# Patient Record
Sex: Female | Born: 1935 | Race: White | Hispanic: No | Marital: Married
Health system: Southern US, Community
[De-identification: ages and names within clinical notes are randomized; demographics above are authoritative.]

---

## 2009-11-07 ENCOUNTER — Ambulatory Visit: Payer: Self-pay | Admitting: Diagnostic Radiology

## 2009-11-07 ENCOUNTER — Emergency Department (HOSPITAL_BASED_OUTPATIENT_CLINIC_OR_DEPARTMENT_OTHER): Admission: EM | Admit: 2009-11-07 | Discharge: 2009-11-07 | Payer: Self-pay | Admitting: Emergency Medicine

## 2011-06-18 IMAGING — CR DG ANKLE COMPLETE 3+V*L*
3 series · 3 of 3 positions shown · non-contrast
Comparison: None

CLINICAL DATA: Fall, left ankle pain

LEFT ANKLE COMPLETE - 3+ VIEW

[t ankle joint ap left]
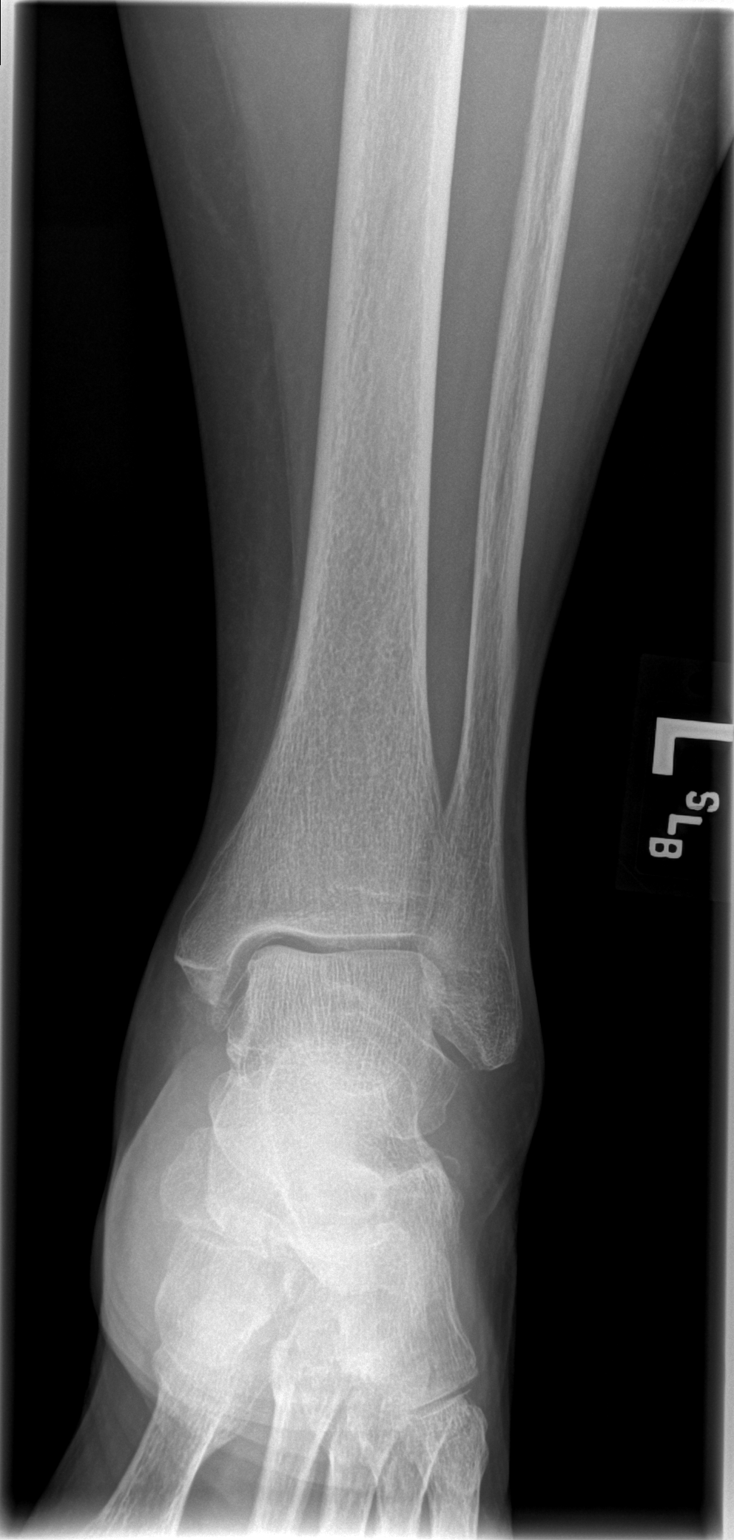

[t ankle joint oblique left]
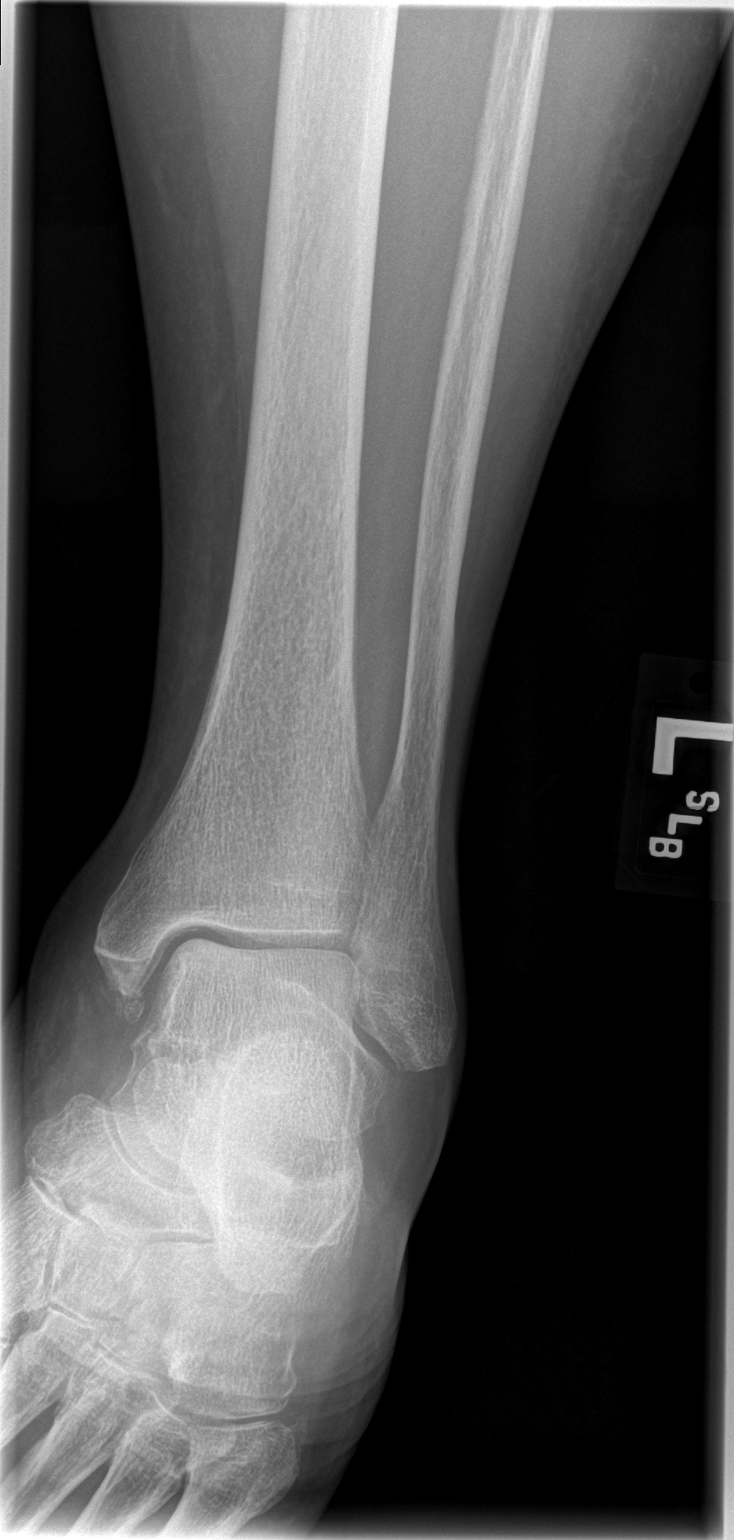

[t ankle joint lat left]
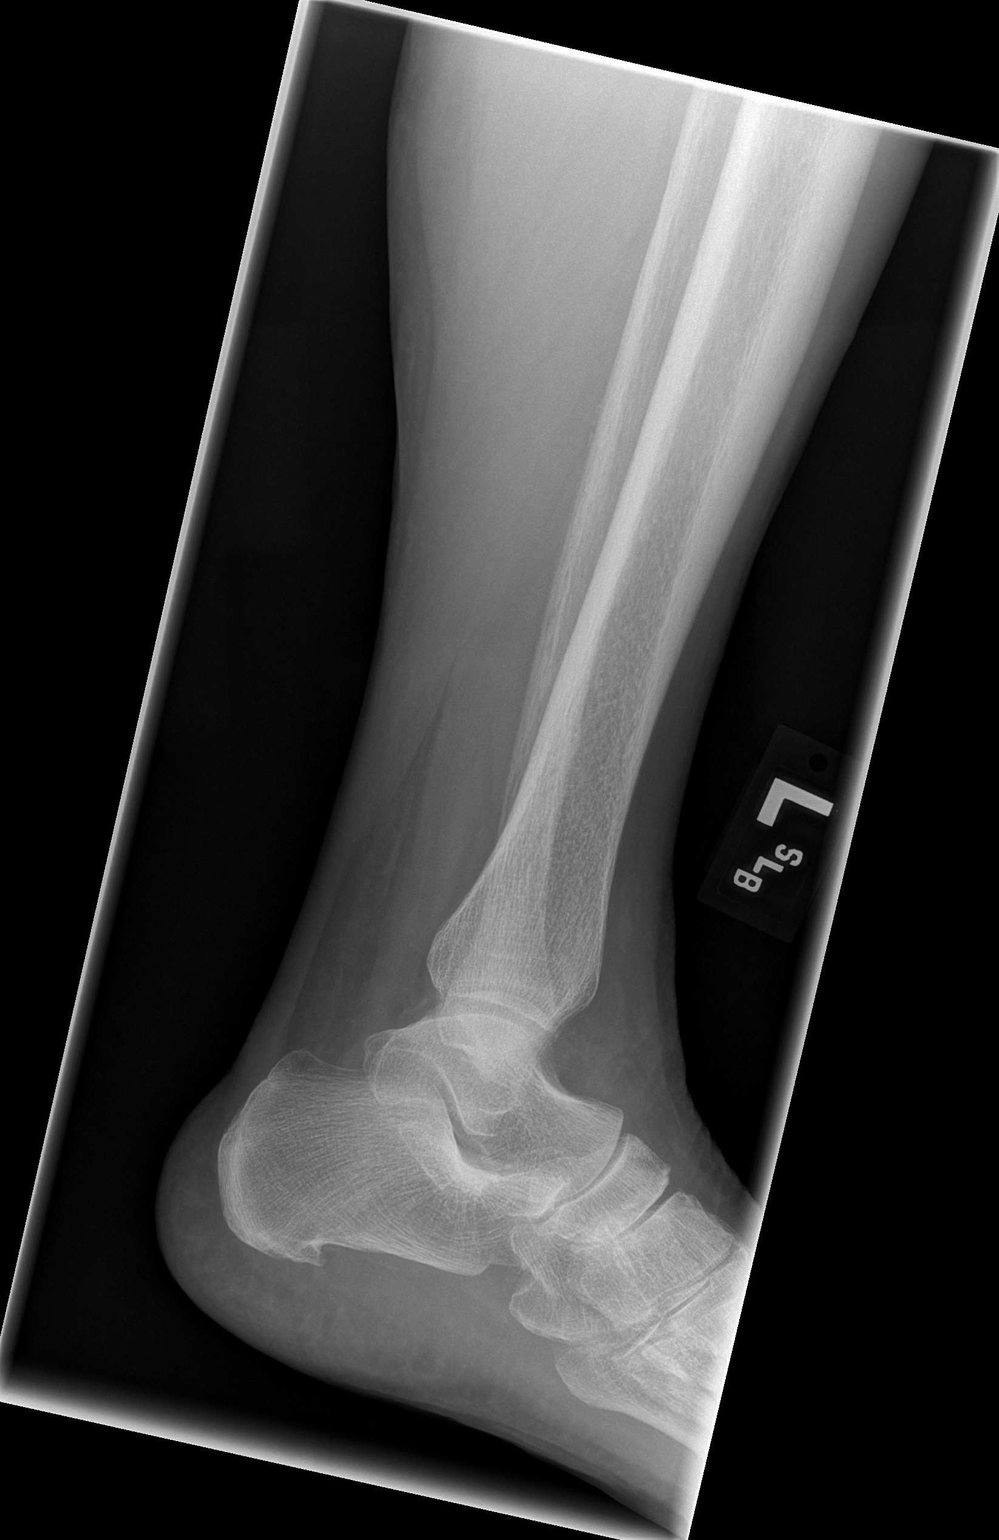

[3 of 3 positions shown; findings below may reference images not displayed]

FINDINGS: Bones demineralized.
Ankle mortise intact.
Thin bone fragment identified at lateral margin of calcaneus
compatible with an avulsion fracture, suspect at calcaneofibular
ligament insertion.
Corticated old appearing ossicles at tip of medial malleolus.
Medial and lateral soft tissue swelling identified.
No additional fracture, dislocation, or bone destruction.
Small plantar calcaneal spur.
IMPRESSION: Lateral calcaneal avulsion fracture, likely at calcaneofibular
ligament insertion.
No definite additional fracture identified.
Bony demineralization.

## 2022-10-28 ENCOUNTER — Emergency Department (HOSPITAL_COMMUNITY): Payer: Medicare Other

## 2022-10-28 ENCOUNTER — Other Ambulatory Visit: Payer: Self-pay

## 2022-10-28 ENCOUNTER — Emergency Department (HOSPITAL_COMMUNITY)
Admission: EM | Admit: 2022-10-28 | Discharge: 2022-10-28 | Disposition: A | Discharge status: Home / Self Care | Payer: Medicare Other | Attending: Emergency Medicine | Admitting: Emergency Medicine

## 2022-10-28 DIAGNOSIS — S52601A Unspecified fracture of lower end of right ulna, initial encounter for closed fracture: Secondary | ICD-10-CM | POA: Diagnosis not present

## 2022-10-28 DIAGNOSIS — M25531 Pain in right wrist: Secondary | ICD-10-CM | POA: Insufficient documentation

## 2022-10-28 DIAGNOSIS — S52501A Unspecified fracture of the lower end of right radius, initial encounter for closed fracture: Secondary | ICD-10-CM | POA: Diagnosis not present

## 2022-10-28 DIAGNOSIS — Z7902 Long term (current) use of antithrombotics/antiplatelets: Secondary | ICD-10-CM | POA: Diagnosis not present

## 2022-10-28 DIAGNOSIS — W010XXA Fall on same level from slipping, tripping and stumbling without subsequent striking against object, initial encounter: Secondary | ICD-10-CM | POA: Insufficient documentation

## 2022-10-28 DIAGNOSIS — Z8673 Personal history of transient ischemic attack (TIA), and cerebral infarction without residual deficits: Secondary | ICD-10-CM | POA: Diagnosis not present

## 2022-10-28 DIAGNOSIS — S59911A Unspecified injury of right forearm, initial encounter: Secondary | ICD-10-CM | POA: Diagnosis present

## 2022-10-28 MED ORDER — ONDANSETRON 4 MG PO TBDP: 4.0000 mg | ORAL_TABLET | Freq: Three times a day (TID) | ORAL | 0 refills | Status: AC | PRN

## 2022-10-28 MED ORDER — ONDANSETRON 4 MG PO TBDP
4.0000 mg | ORAL_TABLET | Freq: Once | ORAL | Status: AC
  Administered 2022-10-28: 4 mg via ORAL
  Filled 2022-10-28: qty 1

## 2022-10-28 MED ORDER — OXYCODONE HCL 5 MG PO TABS
5.0000 mg | ORAL_TABLET | Freq: Once | ORAL | Status: AC
  Administered 2022-10-28: 5 mg via ORAL
  Filled 2022-10-28: qty 1

## 2022-10-28 NOTE — ED Provider Notes (Signed)
[place] EMERGENCY DEPARTMENT AT [facility] Provider Note   CSN: [id number] Arrival date & time: 10/28/22  8657     History  Chief Complaint  Patient presents with   Wrist Injury    Debbie Rhodes is a 89 y.o. female with past medical history significant for stroke presents to the ED complaining of right wrist pain and deformity after mechanical fall.  Patient states she was reaching for something in the pantry, and believes one of her feet slipped out of her slipper causing her to lose her balance and fall.  Patient attempted to break her fall with her right hand.  She does take Plavix daily.  Denies losing consciousness or hitting her head.  Denies other injury related to the fall.       Home Medications Prior to Admission medications   Medication Sig Start Date End Date Taking? Authorizing Provider  ondansetron (ZOFRAN-ODT) 4 MG disintegrating tablet Take 1 tablet (4 mg total) by mouth every 8 (eight) hours as needed for nausea or vomiting. 10/28/22  Yes Mio Schellinger R, PA-C      Allergies    Codeine    Review of Systems   Review of Systems  Musculoskeletal:  Positive for arthralgias and joint swelling. Negative for neck pain.  Neurological:  Negative for syncope.    Physical Exam Updated Vital Signs BP (!) 175/64 (BP Location: Left Arm)   Pulse 62   Temp 98.1 F (36.7 C) (Oral)   Resp 14   Ht 5' (1.524 m)   Wt 68.5 kg   SpO2 95%   BMI 29.49 kg/m  Physical Exam Vitals and nursing note reviewed.  Constitutional:      General: She is not in acute distress.    Appearance: Normal appearance. She is not ill-appearing or diaphoretic.  Cardiovascular:     Rate and Rhythm: Normal rate and regular rhythm.     Pulses:          Radial pulses are 2+ on the right side.  Pulmonary:     Effort: Pulmonary effort is normal.  Musculoskeletal:     Right wrist: Swelling, deformity and tenderness present. Decreased range of motion. Normal pulse.     Comments:  Significant soft tissue swelling and ecchymosis over the right wrist and distal forearm.  Patient has limited ROM due to pain and swelling.  Radial pulse 2+.  Hand is neurovascularly intact.  Skin:    General: Skin is warm and dry.     Capillary Refill: Capillary refill takes less than 2 seconds.  Neurological:     Mental Status: She is alert. Mental status is at baseline.  Psychiatric:        Mood and Affect: Mood normal.        Behavior: Behavior normal.     ED Results / Procedures / Treatments   Labs (all labs ordered are listed, but only abnormal results are displayed) Labs Reviewed - No data to display  EKG EKG Interpretation Date/Time:  Thursday October 28 2022 03:18:19 EDT Ventricular Rate:  69 PR Interval:  170 QRS Duration:  113 QT Interval:  445 QTC Calculation: 470 R Axis:   43  Text Interpretation: Sinus rhythm Borderline intraventricular conduction delay Minimal ST elevation, anterior leads Poor data quality Confirmed by Tilden Fossa [phone]) on 10/28/2022 4:07:11 AM  Radiology DG Wrist Complete Right  Result Date: 10/28/2022 CLINICAL DATA:  Fall EXAM: RIGHT WRIST - COMPLETE 3+ VIEW COMPARISON:  None Available. FINDINGS: Comminuted  fractures of the right distal radius and ulna. There is dorsal angulation and mild lateral displacement of the dominant distal radius fragment. The ulnar styloid fragment is mildly displaced. There is chondrocalcinosis at the wrist joint. IMPRESSION: Comminuted fractures of the right distal radius and ulna with dorsal angulation and mild lateral displacement of the dominant distal radius fragment. Electronically Signed   By: Deatra Robinson M.D.   On: 10/28/2022 03:49    Procedures Procedures    Medications Ordered in ED Medications  oxyCODONE (Oxy IR/ROXICODONE) immediate release tablet 5 mg (5 mg Oral Given 10/28/22 0501)  ondansetron (ZOFRAN-ODT) disintegrating tablet 4 mg (4 mg Oral Given 10/28/22 0502)    ED Course/ Medical Decision  Making/ A&P                                 Medical Decision Making Amount and/or Complexity of Data Reviewed Radiology: ordered.  Risk Prescription drug management.   This patient presents to the ED with chief complaint(s) of right wrist pain and deformity after a mechanical fall with pertinent past medical history of stroke, takes Plavix daily.  The complaint involves an extensive differential diagnosis and also carries with it a high risk of complications and morbidity.    The differential diagnosis includes acute fracture or dislocation, contusion  Initial Assessment:   Exam significant for soft tissue swelling and ecchymosis over the right wrist and distal forearm.  Patient has limited ROM due to pain and swelling.  Radial pulse 2+.  Hand is neurovascularly intact.  No other gross deformities or obvious injuries on exam.  Independent interpretation of imaging: I ordered and personally interpreted x-ray of right wrist which demonstrates comminuted fractures of the distal radius and ulna.  There is dorsal angulation mild lateral displacement of the dominant distal radius fragment.  Ulnar styloid fragment mildly displaced.  Treatment and Reassessment: Patient given oral pain medicine and nausea medication with significant improvement.  Patient placed in a sugar-tong splint and will follow-up outpatient with hand surgery.  Disposition:   Provided patient with referral information to follow-up with hand surgery.  Discussed proper splint care with patient and use of Tylenol for pain.  Will place [facility] consult so that patient is able to receive help at home.  Patient's family is willing to help her until other care can be arranged.  The patient has been appropriately medically screened and/or stabilized in the ED. I have low suspicion for any other emergent medical condition which would require further screening, evaluation or treatment in the ED or require inpatient management. At time of  discharge the patient is hemodynamically stable and in no acute distress. I have discussed work-up results and diagnosis with patient and answered all questions. Patient is agreeable with discharge plan. We discussed strict return precautions for returning to the emergency department and they verbalized understanding.            Final Clinical Impression(s) / ED Diagnoses Final diagnoses:  Closed fracture of distal end of right radius, unspecified fracture morphology, initial encounter  Closed fracture of distal end of right ulna, unspecified fracture morphology, initial encounter    Rx / DC Orders ED Discharge Orders          Ordered    ondansetron (ZOFRAN-ODT) 4 MG disintegrating tablet  Every 8 hours PRN        10/28/22 0543  Melton Alar R, PA-C 10/28/22 0544    Tilden Fossa, MD 10/30/22 [phone]

## 2022-10-28 NOTE — Discharge Instructions (Addendum)
Thank you for allowing [place] to be a part of your care today.  You were evaluated in the ED for injury to your right wrist after a fall.  Your x-ray showed you have broken your wrist.  You were placed in a splint that will need to stay on until you follow up with the hand specialist.  Please call [facility] either today or tomorrow to set up an appointment with hand specialist.  I recommend taking [phone] mg of Tylenol every 6-8 hours as needed for pain.  Do not exceed 3000 mg of Tylenol in a 24-hour period.  We have placed a consultation with our transitions of care team.  They should contact to later today to discuss your needs at home.  Return to the ED if you develop sudden worsening of your symptoms or if you have any new concerns.

## 2022-10-28 NOTE — Progress Notes (Signed)
Orthopedic Tech Progress Note Patient Details:  Debbie Rhodes 1934/01/13 [id number]  Ortho Devices Type of Ortho Device: Sugartong splint Ortho Device/Splint Location: RUE Ortho Device/Splint Interventions: Ordered, Application, Adjustment   Post Interventions Patient Tolerated: Well Instructions Provided: Care of device  [name] 10/28/2022, 5:37 AM

## 2022-10-28 NOTE — ED Triage Notes (Signed)
Pt arrives to ED c/o right wrist deformity after mechanical fall. Pt is on plavix, denies head strike and LOC.
